# Patient Record
Sex: Male | Born: 1985 | Hispanic: No | Marital: Single
Health system: Southern US, Community
[De-identification: ages and names within clinical notes are randomized; demographics above are authoritative.]

---

## 2015-11-15 ENCOUNTER — Ambulatory Visit (INDEPENDENT_AMBULATORY_CARE_PROVIDER_SITE_OTHER): Payer: Self-pay | Admitting: Orthopaedic Surgery

## 2015-11-15 ENCOUNTER — Ambulatory Visit (INDEPENDENT_AMBULATORY_CARE_PROVIDER_SITE_OTHER): Payer: BLUE CROSS/BLUE SHIELD | Admitting: Orthopaedic Surgery

## 2015-11-17 ENCOUNTER — Other Ambulatory Visit (INDEPENDENT_AMBULATORY_CARE_PROVIDER_SITE_OTHER): Payer: Self-pay | Admitting: Orthopaedic Surgery

## 2015-11-17 DIAGNOSIS — M25552 Pain in left hip: Secondary | ICD-10-CM

## 2015-11-19 ENCOUNTER — Other Ambulatory Visit (INDEPENDENT_AMBULATORY_CARE_PROVIDER_SITE_OTHER): Payer: Self-pay | Admitting: Orthopaedic Surgery

## 2015-11-19 DIAGNOSIS — M25552 Pain in left hip: Secondary | ICD-10-CM

## 2015-12-18 ENCOUNTER — Telehealth (INDEPENDENT_AMBULATORY_CARE_PROVIDER_SITE_OTHER): Payer: Self-pay | Admitting: Orthopaedic Surgery

## 2015-12-18 NOTE — Telephone Encounter (Signed)
Please send copy of most recent office note to Dr. Ludwig ClarksMoreira. Fax#[phone]

## 2015-12-18 NOTE — Telephone Encounter (Signed)
Done

## 2015-12-25 ENCOUNTER — Other Ambulatory Visit: Payer: Self-pay

## 2016-09-10 ENCOUNTER — Other Ambulatory Visit: Payer: Self-pay | Admitting: Internal Medicine

## 2016-09-10 DIAGNOSIS — R109 Unspecified abdominal pain: Secondary | ICD-10-CM

## 2016-09-19 ENCOUNTER — Other Ambulatory Visit: Payer: Self-pay | Admitting: Internal Medicine

## 2016-09-19 DIAGNOSIS — M25552 Pain in left hip: Secondary | ICD-10-CM

## 2016-09-19 DIAGNOSIS — R109 Unspecified abdominal pain: Secondary | ICD-10-CM

## 2016-09-19 DIAGNOSIS — M7918 Myalgia, other site: Secondary | ICD-10-CM

## 2016-09-24 ENCOUNTER — Other Ambulatory Visit: Payer: Self-pay | Admitting: Internal Medicine

## 2016-09-30 ENCOUNTER — Ambulatory Visit
Admission: RE | Admit: 2016-09-30 | Discharge: 2016-09-30 | Disposition: A | Discharge status: Home / Self Care | Payer: BLUE CROSS/BLUE SHIELD | Source: Ambulatory Visit | Attending: Internal Medicine | Admitting: Internal Medicine

## 2016-09-30 DIAGNOSIS — M25552 Pain in left hip: Secondary | ICD-10-CM

## 2016-09-30 DIAGNOSIS — M7918 Myalgia, other site: Secondary | ICD-10-CM

## 2016-09-30 MED ORDER — GADOBENATE DIMEGLUMINE 529 MG/ML IV SOLN
14.0000 mL | Freq: Once | INTRAVENOUS | Status: AC | PRN
  Administered 2016-09-30: 14 mL via INTRAVENOUS

## 2018-08-23 IMAGING — MR MR HIP*L* WO/W CM
5 of 7 series · 29 of 40 positions shown · IV contrast (multihance)
Comparison: Single-view of the pelvis performed at [REDACTED] 11/15/2015.

CLINICAL DATA: Sharp pain in the posterior aspect of the left hip
which is worse with sitting or getting up from the floor for over 1
year. No known injury.

EXAM:
MRI OF THE LEFT HIP WITHOUT AND WITH CONTRAST
TECHNIQUE: Multiplanar, multisequence MR imaging was performed both before and
after administration of intravenous contrast.
CONTRAST:  14 ml MULTIHANCE GADOBENATE DIMEGLUMINE 529 MG/ML IV SOLN

[Series 2: T1 · coronal · 4.0mm · 0.56mm/px · 6 of 18 slices shown (1 of 2)]
[im 1/18]
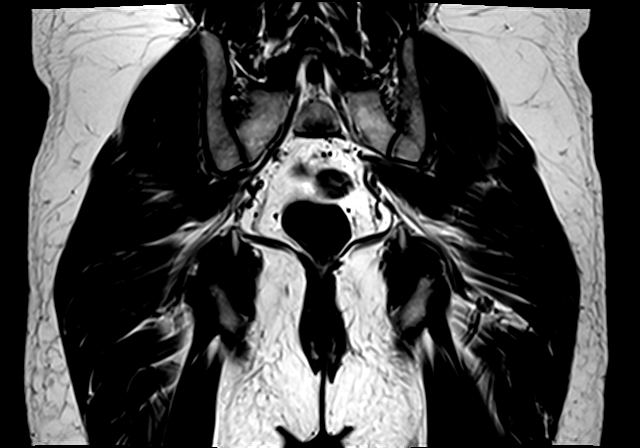
[im 4/18]
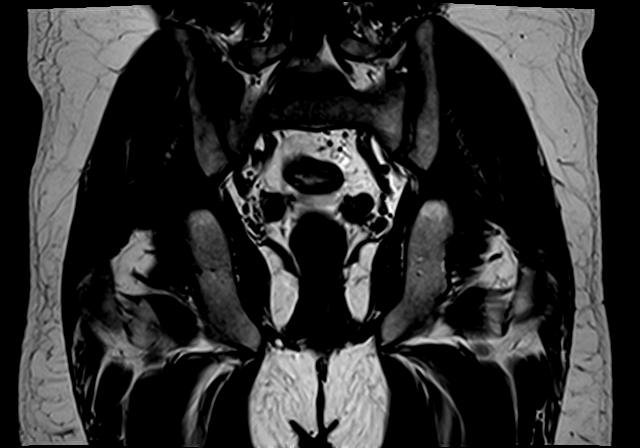
[im 7/18]
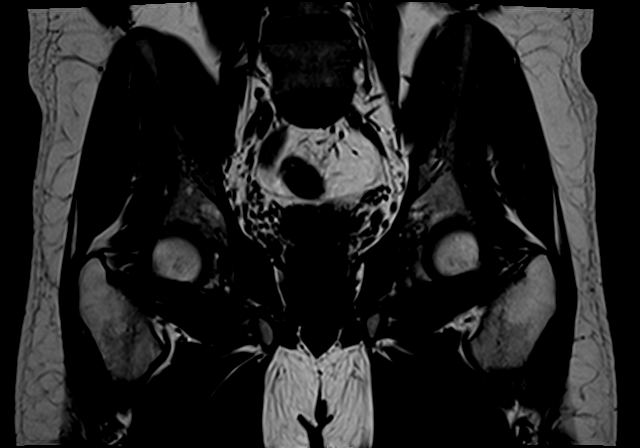
[im 11/18]
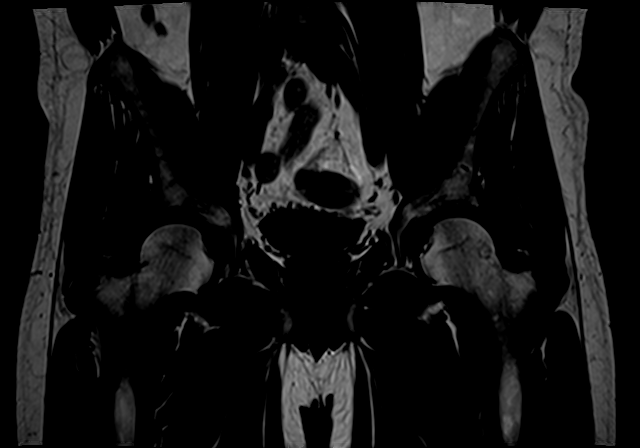
[im 14/18]
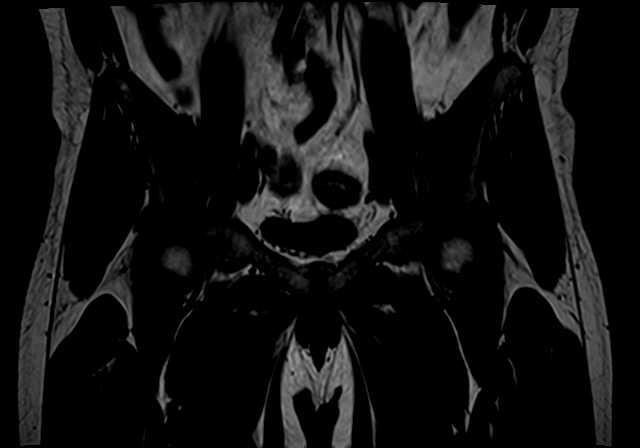
[im 18/18]
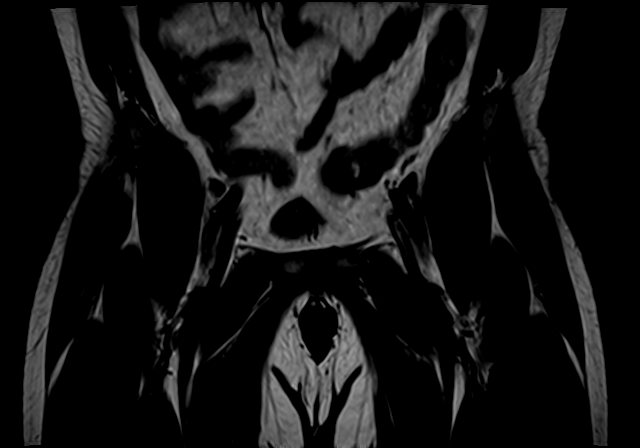

[Series 4: T2 fat-sat · axial · 4.0mm · 0.56mm/px · z∈[-80,+31]mm · 6 of 24 slices shown]
[im 1/24]
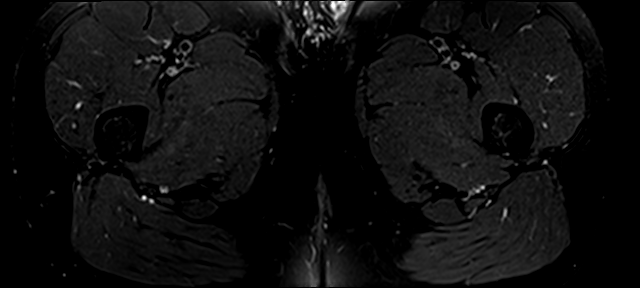
[im 5/24]
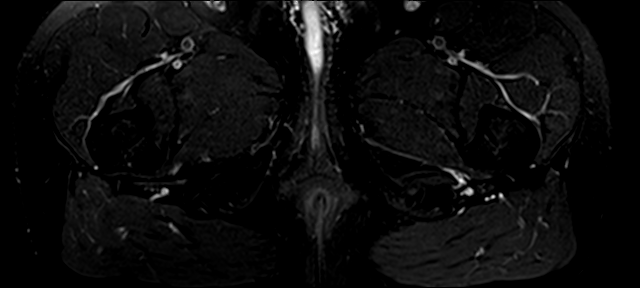
[im 10/24]
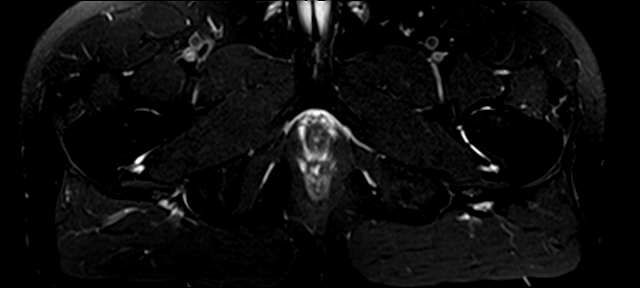
[im 14/24]
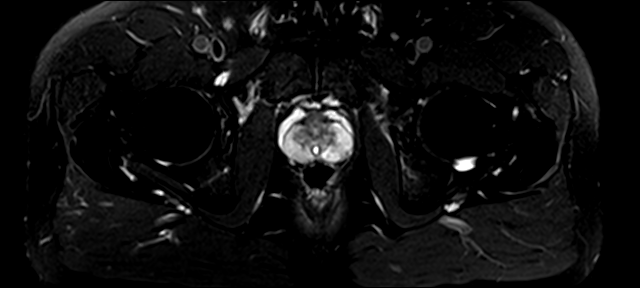
[im 19/24]
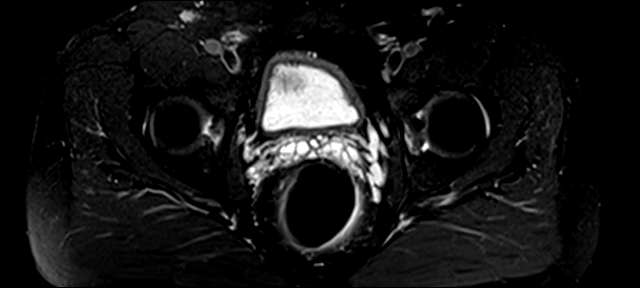
[im 24/24]
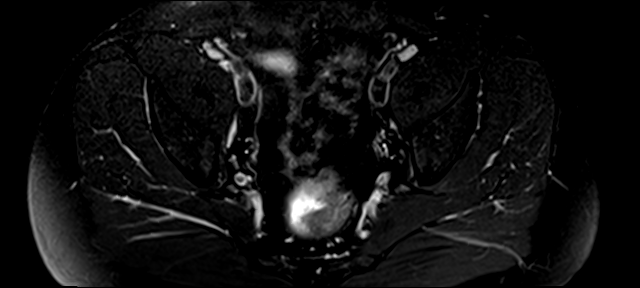

[Series 5: T1 · axial · 4.0mm · 0.56mm/px · z∈[-80,+31]mm · 6 of 24 slices shown (2 of 2)]
[im 1/24]
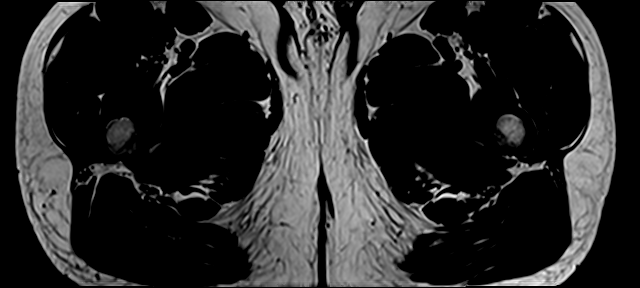
[im 5/24]
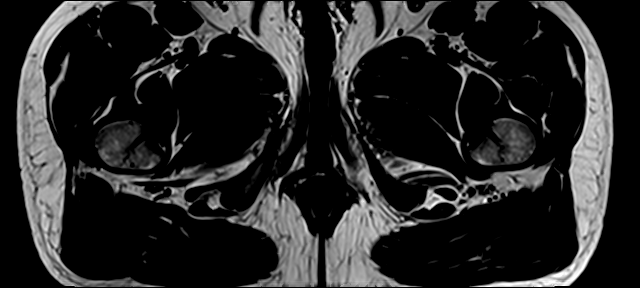
[im 10/24]
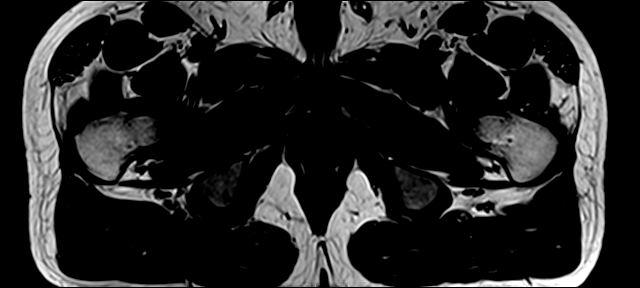
[im 14/24]
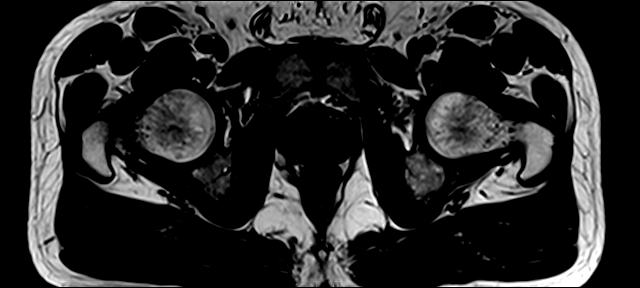
[im 19/24]
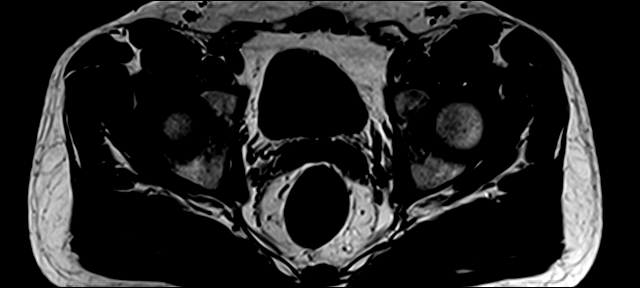
[im 24/24]
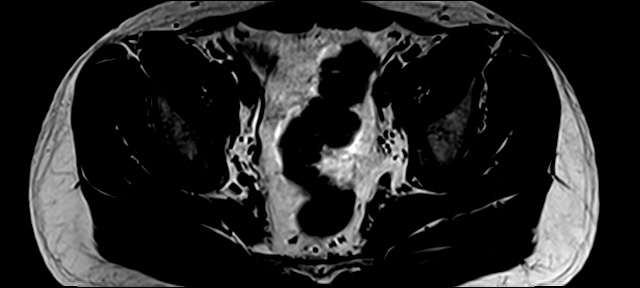

[Series 6: PD · sagittal · 4.0mm · 0.73mm/px · 6 of 21 slices shown]
[im 1/21]
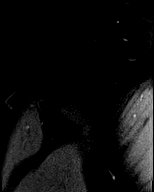
[im 5/21]
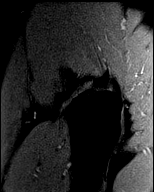
[im 9/21]
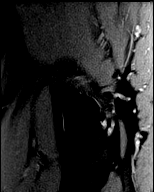
[im 13/21]
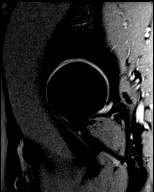
[im 17/21]
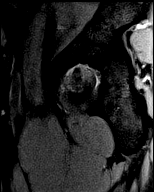
[im 21/21]
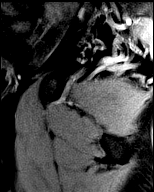

[Series 7: T1 fat-sat post-contrast · axial · 4.0mm · 0.56mm/px · z∈[-80,+7]mm · 5 of 24 slices shown]
[im 1/24]
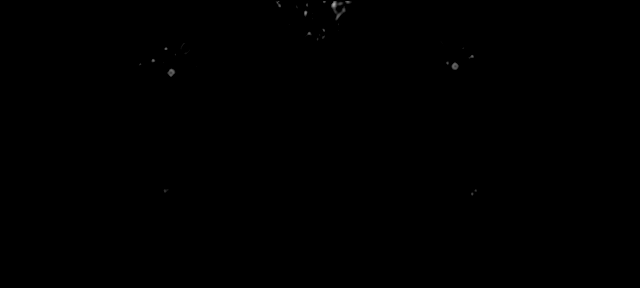
[im 5/24]
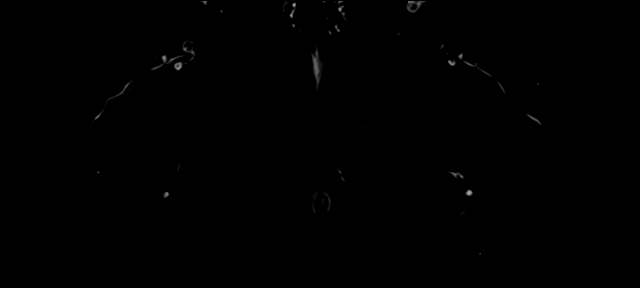
[im 10/24]
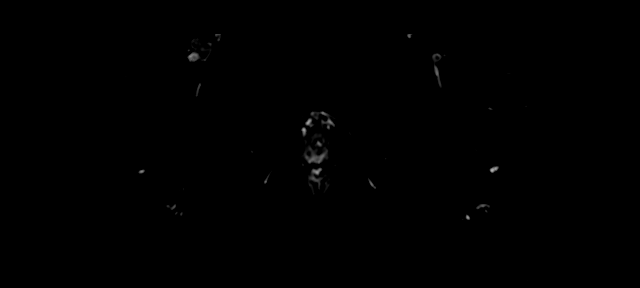
[im 14/24]
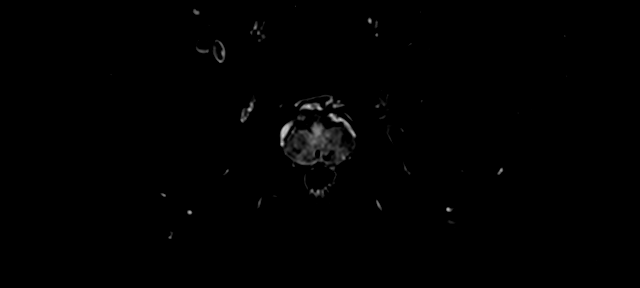
[im 19/24]
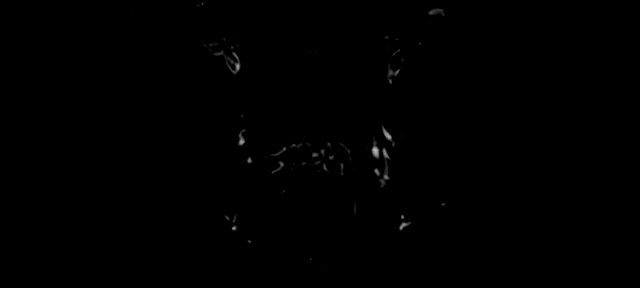

[29 of 40 positions shown; findings below may reference images not displayed]

FINDINGS: Bones: A small benign synovial herniation pit is noted in the left
femoral neck. Bone marrow signal is otherwise normal without
fracture, stress change or focal lesion. No avascular necrosis of
the femoral heads.

Articular cartilage and labrum

Articular cartilage:  Normal.

Labrum:  Normal.

Joint or bursal effusion

Joint effusion:  None.

Bursae:  Normal.

Muscles and tendons

Muscles and tendons:  Normal.

Other findings

Miscellaneous:  Imaged intrapelvic contents are normal.
IMPRESSION: Normal examination without finding to explain the patient's
symptoms. Small synovial herniation pit in the left femoral neck is
incidentally noted.
